# Patient Record
Sex: Female | Born: 1985 | Race: Black or African American | Hispanic: No | Marital: Single | Smoking: Never smoker
Health system: Southern US, Community
[De-identification: ages and names within clinical notes are randomized; demographics above are authoritative.]

## PROBLEM LIST (undated history)

## (undated) DIAGNOSIS — K219 Gastro-esophageal reflux disease without esophagitis: Secondary | ICD-10-CM

## (undated) DIAGNOSIS — F32A Depression, unspecified: Secondary | ICD-10-CM

## (undated) DIAGNOSIS — F419 Anxiety disorder, unspecified: Secondary | ICD-10-CM

## (undated) DIAGNOSIS — K3184 Gastroparesis: Secondary | ICD-10-CM

## (undated) HISTORY — DX: Gastroparesis: K31.84

## (undated) HISTORY — DX: Depression, unspecified: F32.A

## (undated) HISTORY — DX: Anxiety disorder, unspecified: F41.9

## (undated) HISTORY — DX: Gastro-esophageal reflux disease without esophagitis: K21.9

## (undated) HISTORY — PX: UMBILICAL HERNIA REPAIR: SHX196

## (undated) HISTORY — PX: HERNIA REPAIR: SHX51

## (undated) HISTORY — PX: OTHER SURGICAL HISTORY: SHX169

---

## 2005-03-26 ENCOUNTER — Emergency Department: Payer: Self-pay | Admitting: Emergency Medicine

## 2005-12-09 ENCOUNTER — Emergency Department: Payer: Self-pay | Admitting: Emergency Medicine

## 2007-08-19 ENCOUNTER — Emergency Department: Payer: Self-pay | Admitting: Emergency Medicine

## 2009-11-22 HISTORY — PX: UMBILICAL HERNIA REPAIR: SHX196

## 2010-04-14 ENCOUNTER — Emergency Department: Payer: Self-pay | Admitting: Emergency Medicine

## 2014-01-30 ENCOUNTER — Ambulatory Visit: Payer: Self-pay | Admitting: Family Medicine

## 2014-01-30 LAB — CBC WITH DIFFERENTIAL/PLATELET
BASOS ABS: 0 10*3/uL (ref 0.0–0.1)
Basophil %: 0.8 %
EOS PCT: 0.9 %
Eosinophil #: 0 10*3/uL (ref 0.0–0.7)
HCT: 37.8 % (ref 35.0–47.0)
HGB: 12.3 g/dL (ref 12.0–16.0)
Lymphocyte #: 1.7 10*3/uL (ref 1.0–3.6)
Lymphocyte %: 32.7 %
MCH: 28.7 pg (ref 26.0–34.0)
MCHC: 32.7 g/dL (ref 32.0–36.0)
MCV: 88 fL (ref 80–100)
MONOS PCT: 6.6 %
Monocyte #: 0.4 x10 3/mm (ref 0.2–0.9)
NEUTROS ABS: 3.2 10*3/uL (ref 1.4–6.5)
Neutrophil %: 59 %
PLATELETS: 226 10*3/uL (ref 150–440)
RBC: 4.29 10*6/uL (ref 3.80–5.20)
RDW: 16.8 % — AB (ref 11.5–14.5)
WBC: 5.4 10*3/uL (ref 3.6–11.0)

## 2014-01-30 LAB — MONONUCLEOSIS SCREEN: Mono Test: NEGATIVE

## 2014-01-30 LAB — RAPID STREP-A WITH REFLX: Micro Text Report: NEGATIVE

## 2014-02-02 LAB — BETA STREP CULTURE(ARMC)

## 2014-02-04 ENCOUNTER — Ambulatory Visit (INDEPENDENT_AMBULATORY_CARE_PROVIDER_SITE_OTHER): Payer: 59 | Admitting: Infectious Disease

## 2014-02-04 ENCOUNTER — Encounter: Payer: Self-pay | Admitting: Infectious Disease

## 2014-02-04 VITALS — BP 128/84 | HR 64 | Temp 98.4°F | Wt 163.0 lb

## 2014-02-04 DIAGNOSIS — R7611 Nonspecific reaction to tuberculin skin test without active tuberculosis: Secondary | ICD-10-CM

## 2014-02-04 DIAGNOSIS — R1319 Other dysphagia: Secondary | ICD-10-CM

## 2014-02-04 DIAGNOSIS — Z113 Encounter for screening for infections with a predominantly sexual mode of transmission: Secondary | ICD-10-CM

## 2014-02-04 DIAGNOSIS — R490 Dysphonia: Secondary | ICD-10-CM | POA: Insufficient documentation

## 2014-02-04 DIAGNOSIS — R1314 Dysphagia, pharyngoesophageal phase: Secondary | ICD-10-CM

## 2014-02-04 DIAGNOSIS — K219 Gastro-esophageal reflux disease without esophagitis: Secondary | ICD-10-CM

## 2014-02-04 DIAGNOSIS — R1310 Dysphagia, unspecified: Secondary | ICD-10-CM

## 2014-02-04 DIAGNOSIS — K3184 Gastroparesis: Secondary | ICD-10-CM | POA: Insufficient documentation

## 2014-02-04 DIAGNOSIS — R61 Generalized hyperhidrosis: Secondary | ICD-10-CM

## 2014-02-04 LAB — CBC WITH DIFFERENTIAL/PLATELET
BASOS ABS: 0 10*3/uL (ref 0.0–0.1)
Basophils Relative: 1 % (ref 0–1)
Eosinophils Absolute: 0 10*3/uL (ref 0.0–0.7)
Eosinophils Relative: 1 % (ref 0–5)
HCT: 34.4 % — ABNORMAL LOW (ref 36.0–46.0)
HEMOGLOBIN: 11.6 g/dL — AB (ref 12.0–15.0)
LYMPHS ABS: 1.7 10*3/uL (ref 0.7–4.0)
LYMPHS PCT: 41 % (ref 12–46)
MCH: 28.7 pg (ref 26.0–34.0)
MCHC: 33.7 g/dL (ref 30.0–36.0)
MCV: 85.1 fL (ref 78.0–100.0)
Monocytes Absolute: 0.3 10*3/uL (ref 0.1–1.0)
Monocytes Relative: 6 % (ref 3–12)
NEUTROS ABS: 2.1 10*3/uL (ref 1.7–7.7)
Neutrophils Relative %: 51 % (ref 43–77)
Platelets: 245 10*3/uL (ref 150–400)
RBC: 4.04 MIL/uL (ref 3.87–5.11)
RDW: 17.3 % — ABNORMAL HIGH (ref 11.5–15.5)
WBC: 4.2 10*3/uL (ref 4.0–10.5)

## 2014-02-04 MED ORDER — VITAMIN B-6 50 MG PO TABS: 50.0000 mg | ORAL_TABLET | Freq: Every day | ORAL | Status: DC

## 2014-02-04 MED ORDER — ISONIAZID 300 MG PO TABS: 300.0000 mg | ORAL_TABLET | Freq: Every day | ORAL | Status: DC

## 2014-02-04 NOTE — Progress Notes (Signed)
Subjective:    Patient ID: Sally Webb, female    DOB: 1986-10-23, 28 y.o.   MRN: [id number]  HPI  28 year old [place] American lady with PMHX significant for ventral hernia repair now with apparenty esophageal dysfunction, dysphagia of solid foods with vomiting up of food contents chronically, having refused corrective surgery. Several weeks ago she had developed severe anterior chest pain following several episodes of vomiting. She had also had night sweats that occurred in context of what she thought was a GI viral illness marked by diarrhea, and vomting that resolved and with it resolved the night sweats. She is working in the health care setting and had a PPD placed coincident with these symptoms and this was positive. She had negative CXR x 2 in March and was referred to [place].   Review of Systems  Constitutional: Negative for fever, chills, diaphoresis, activity change, appetite change, fatigue and unexpected weight change.  HENT: Negative for congestion, rhinorrhea, sinus pressure, sneezing, sore throat and trouble swallowing.   Eyes: Negative for photophobia and visual disturbance.  Respiratory: Negative for cough, chest tightness, shortness of breath, wheezing and stridor.   Cardiovascular: Negative for chest pain, palpitations and leg swelling.  Gastrointestinal: Positive for vomiting and abdominal pain. Negative for nausea, diarrhea, constipation, blood in stool, abdominal distention and anal bleeding.  Genitourinary: Negative for dysuria, hematuria, flank pain and difficulty urinating.  Musculoskeletal: Negative for arthralgias, back pain, gait problem, joint swelling and myalgias.  Skin: Negative for color change, pallor, rash and wound.  Neurological: Negative for dizziness, tremors, weakness and light-headedness.  Hematological: Negative for adenopathy. Does not bruise/bleed easily.  Psychiatric/Behavioral: Negative for behavioral problems, confusion, sleep disturbance,  dysphoric mood, decreased concentration and agitation.       Objective:   Physical Exam  Constitutional: She is oriented to person, place, and time. She appears well-developed and well-nourished. No distress.  HENT:  Head: Normocephalic and atraumatic.  Mouth/Throat: Posterior oropharyngeal erythema present. No oropharyngeal exudate.  Eyes: Conjunctivae and EOM are normal. No scleral icterus.  Neck: Normal range of motion. Neck supple. No JVD present.  Cardiovascular: Normal rate, regular rhythm and normal heart sounds.  Exam reveals no gallop and no friction rub.   No murmur heard. Pulmonary/Chest: Effort normal and breath sounds normal. No respiratory distress. She has no wheezes. She has no rales. She exhibits no tenderness.  Abdominal: She exhibits no distension and no mass. There is tenderness in the right upper quadrant, right lower quadrant, left upper quadrant and left lower quadrant. There is no rebound and no guarding.  Musculoskeletal: She exhibits no edema and no tenderness.  Lymphadenopathy:    She has no cervical adenopathy.  Neurological: She is alert and oriented to person, place, and time. She exhibits normal muscle tone. Coordination normal.  Skin: Skin is warm and dry. She is not diaphoretic. No erythema. No pallor.  Psychiatric: She has a normal mood and affect. Her behavior is normal. Judgment and thought content normal.          Assessment & Plan:   + PPD: zero evidence of pulmonary or extra pulmonary TB --therefore would rx with 9 months of INH and b6 which she can receive from health dept for free  Night sweats: were likely related to her acute GI illness and have resolved.   --I will check HIV ab and hep panel but not much point in checking for EBV and CMV since ssx have resolved .45   Hoarse voice and sore  throat: likely due to daily vomiting due to dysphagia, and gastro-esophageal dysfunction ? Anatomy --I think she should have EGD and referral for  surgery since this is persisting and vomiting and dysphagia are NOT good for her long term health --PPI may help a little but it WILL NOT correct the underlying problem

## 2014-02-05 LAB — COMPLETE METABOLIC PANEL WITH GFR
ALBUMIN: 4.4 g/dL (ref 3.5–5.2)
ALT: 8 U/L (ref 0–35)
AST: 30 U/L (ref 0–37)
Alkaline Phosphatase: 49 U/L (ref 39–117)
BUN: 4 mg/dL — AB (ref 6–23)
CALCIUM: 9.4 mg/dL (ref 8.4–10.5)
CHLORIDE: 104 meq/L (ref 96–112)
CO2: 24 meq/L (ref 19–32)
Creat: 0.75 mg/dL (ref 0.50–1.10)
GFR, Est African American: >89 mL/min
GFR, Est Non African American: >89 mL/min
Glucose, Bld: 79 mg/dL (ref 70–99)
POTASSIUM: 3.9 meq/L (ref 3.5–5.3)
Sodium: 137 mEq/L (ref 135–145)
Total Bilirubin: 0.6 mg/dL (ref 0.2–1.2)
Total Protein: 7.2 g/dL (ref 6.0–8.3)

## 2014-02-05 LAB — HIV ANTIBODY (ROUTINE TESTING W REFLEX): HIV: NONREACTIVE

## 2015-12-11 IMAGING — CR DG CHEST 2V
1 series · 2 of 2 positions shown · non-contrast
Comparison: none

[Series 1: pa · 0.17mm/px · 2 of 2 slices shown]
[im 1/2]
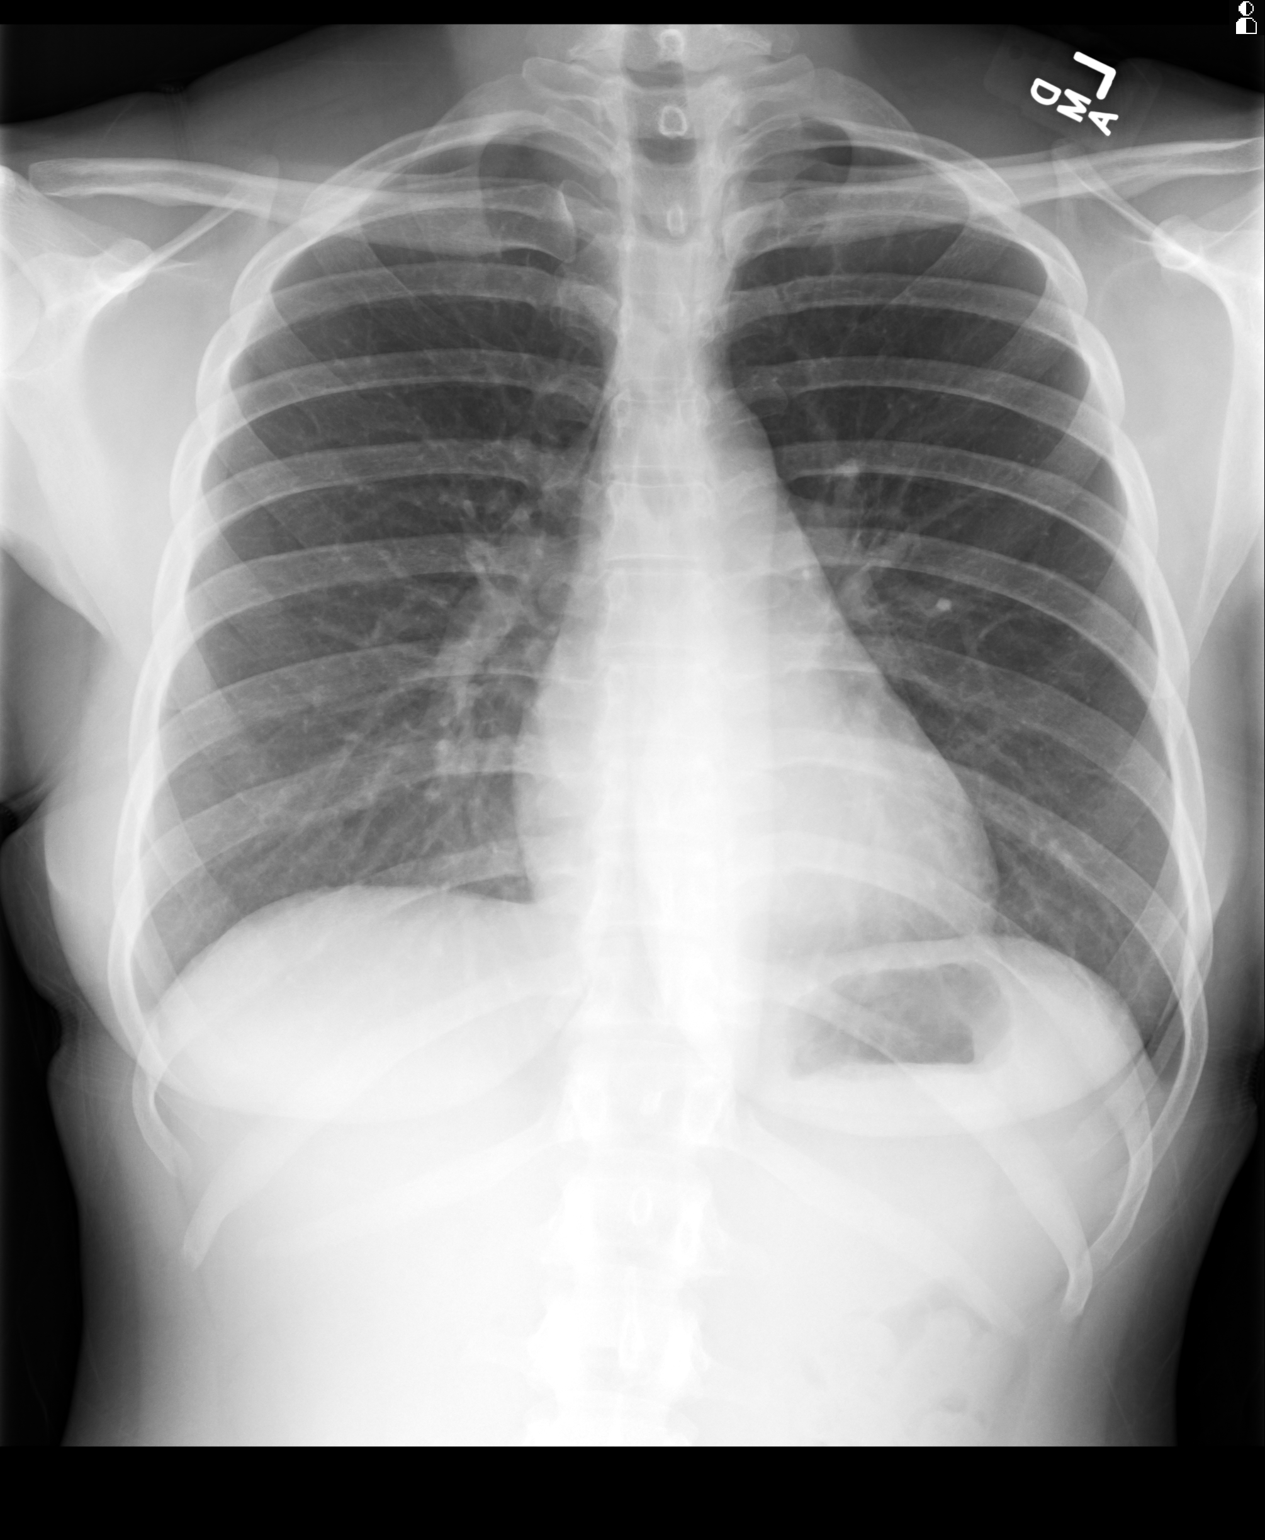
[im 2/2]
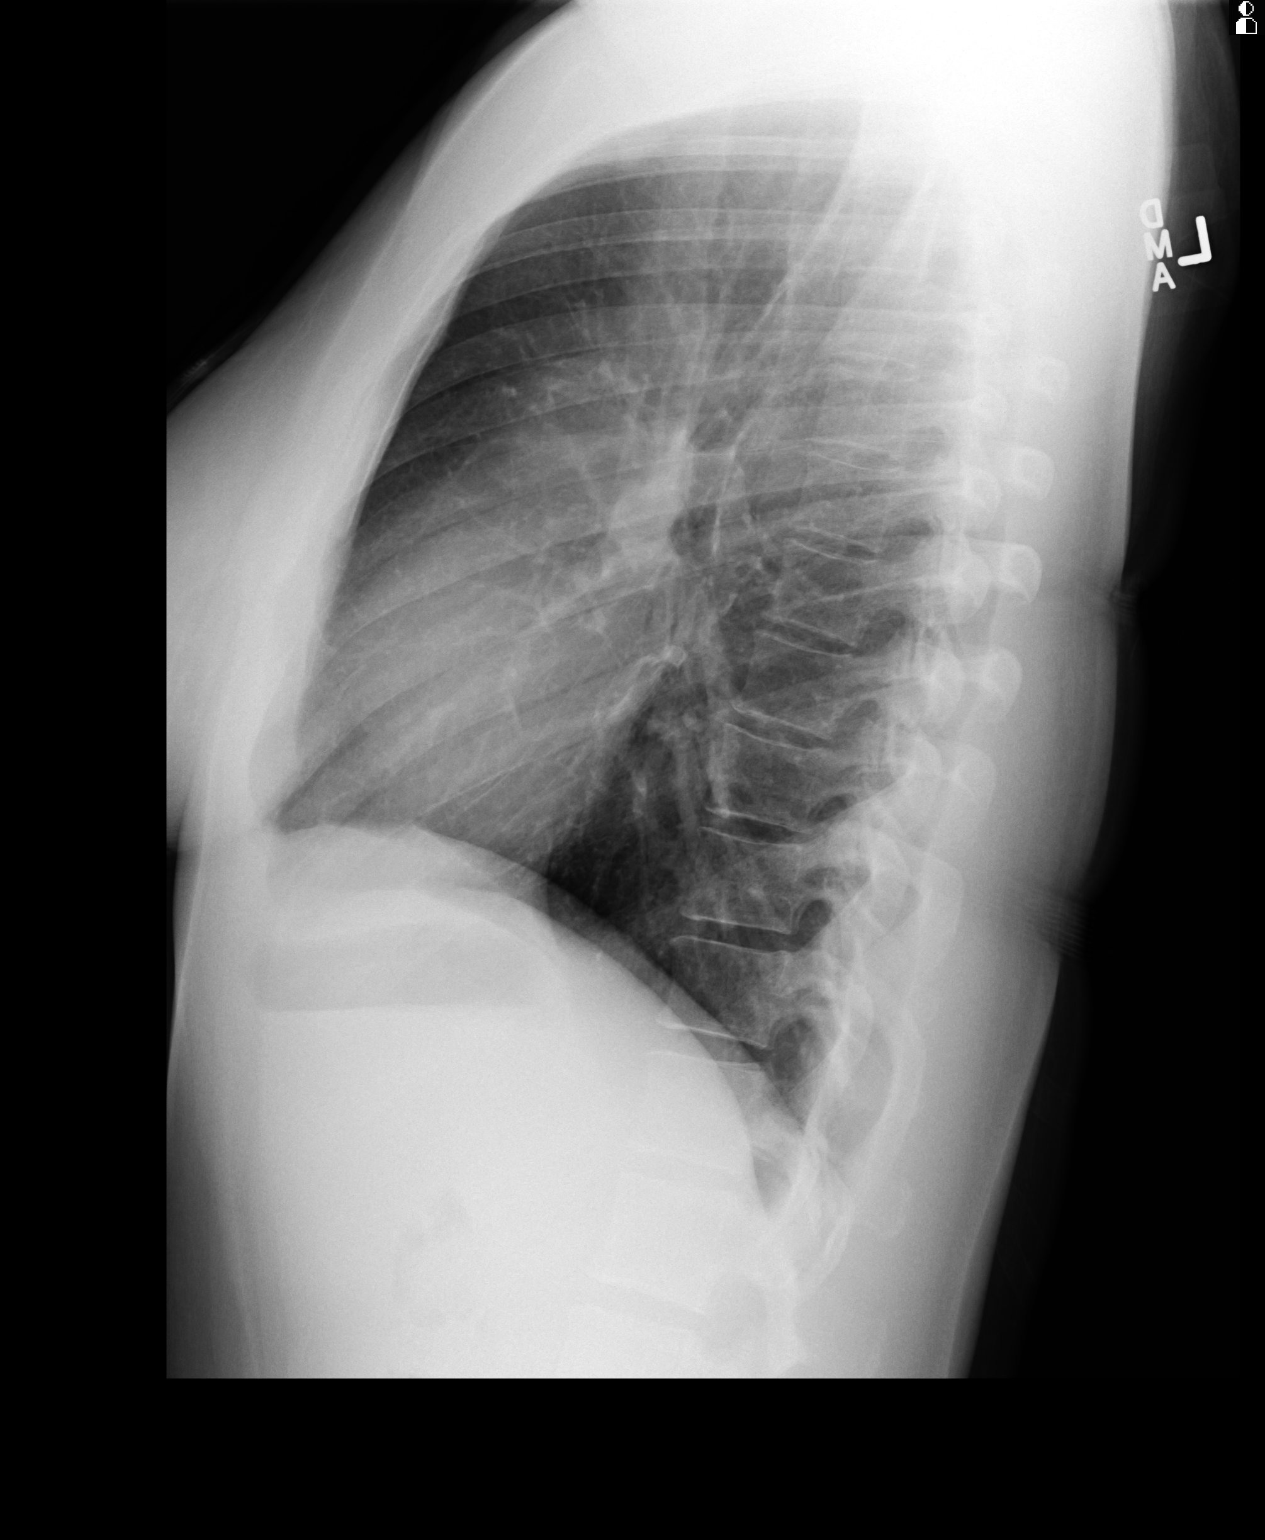

[2 of 2 positions shown; findings below may reference images not displayed]

CLINICAL DATA
Chest pain and difficulty breathing; positive tuberculin skin test

EXAM
CHEST  2 VIEW

COMPARISON
None.

FINDINGS
Lungs are clear. Heart size and pulmonary vascularity are normal. No
adenopathy. No bone lesions.

IMPRESSION
No abnormality noted.

SIGNATURE

## 2017-06-06 DIAGNOSIS — R112 Nausea with vomiting, unspecified: Secondary | ICD-10-CM | POA: Insufficient documentation

## 2022-07-30 ENCOUNTER — Encounter: Payer: Self-pay | Admitting: Nurse Practitioner

## 2022-07-30 ENCOUNTER — Ambulatory Visit: Payer: No Typology Code available for payment source | Admitting: Nurse Practitioner

## 2022-07-30 VITALS — BP 128/72 | HR 82 | Temp 96.3°F | Ht 65.25 in | Wt 169.0 lb

## 2022-07-30 DIAGNOSIS — K3184 Gastroparesis: Secondary | ICD-10-CM | POA: Diagnosis not present

## 2022-07-30 DIAGNOSIS — Z1159 Encounter for screening for other viral diseases: Secondary | ICD-10-CM

## 2022-07-30 DIAGNOSIS — R1084 Generalized abdominal pain: Secondary | ICD-10-CM | POA: Diagnosis not present

## 2022-07-30 DIAGNOSIS — R519 Headache, unspecified: Secondary | ICD-10-CM

## 2022-07-30 DIAGNOSIS — N946 Dysmenorrhea, unspecified: Secondary | ICD-10-CM

## 2022-07-30 DIAGNOSIS — Z114 Encounter for screening for human immunodeficiency virus [HIV]: Secondary | ICD-10-CM

## 2022-07-30 DIAGNOSIS — Z7689 Persons encountering health services in other specified circumstances: Secondary | ICD-10-CM

## 2022-07-30 LAB — CBC
HCT: 38.4 % (ref 36.0–46.0)
Hemoglobin: 13.2 g/dL (ref 12.0–15.0)
MCHC: 34.5 g/dL (ref 30.0–36.0)
MCV: 95.2 fl (ref 78.0–100.0)
Platelets: 214 10*3/uL (ref 150.0–400.0)
RBC: 4.04 Mil/uL (ref 3.87–5.11)
RDW: 13.5 % (ref 11.5–15.5)
WBC: 3.5 10*3/uL — ABNORMAL LOW (ref 4.0–10.5)

## 2022-07-30 LAB — COMPREHENSIVE METABOLIC PANEL
ALT: 7 U/L (ref 0–35)
AST: 16 U/L (ref 0–37)
Albumin: 4.3 g/dL (ref 3.5–5.2)
Alkaline Phosphatase: 50 U/L (ref 39–117)
BUN: 6 mg/dL (ref 6–23)
CO2: 26 mEq/L (ref 19–32)
Calcium: 9.6 mg/dL (ref 8.4–10.5)
Chloride: 102 mEq/L (ref 96–112)
Creatinine, Ser: 0.89 mg/dL (ref 0.40–1.20)
GFR: 83.46 mL/min (ref >60.00)
Glucose, Bld: 82 mg/dL (ref 70–99)
Potassium: 4.3 mEq/L (ref 3.5–5.1)
Sodium: 137 mEq/L (ref 135–145)
Total Bilirubin: 1.1 mg/dL (ref 0.2–1.2)
Total Protein: 7.3 g/dL (ref 6.0–8.3)

## 2022-07-30 LAB — TSH: TSH: 1.28 u[IU]/mL (ref 0.35–5.50)

## 2022-07-30 LAB — LIPASE: Lipase: 26 U/L (ref 11.0–59.0)

## 2022-07-30 NOTE — Progress Notes (Signed)
New Patient Office Visit  Subjective    Patient ID: Sally Webb, female    DOB: 1986-11-06  Age: 36 y.o. MRN: [id number]  CC:  Chief Complaint  Patient presents with   Establish Care   Dysmenorrhea    Feels like she needs to have hormones checked her symptoms of cycle hare lasting longer.    Headache    Increased to daily for the last month.     HPI Sally Webb presents to establish care  Period issues: States that over the past month she has noticed a difference. States that when she had her cycle she was cramping that did not stop. States that it will last 2-3 hours and then hit with a gas pain. Has been like that since last cycle started on 07/13/2022. Cycle last 4 days . History of 1 ovarian cyst in the past. State no chance of pregnancy.  Headaches: States that she started to notice that before the menstraul cycle. She will feel faint/lightheaded and started approx 1-2 years ago. States happened for a month and then stopped. States that in June around the period  she started having faint feeling and could not look up . Motion sickness has helped. This past month it starts out that way. States that it happens every morning and will cause a aura in her perphery and the headache will come. Headache lasts approx 2-3 hours. Has not tired tylenol and ibuprofen. Gets naueous and no light or sound.   History of gastro paresis: use to be followed by Gi in [place]. States that she is managed by de[facility]. States that she can eat certain things. Does not want meat. States that she will comit and experience. Tdap: update Pap smear: 2021 [place] Covid: vaccines HPV: up to date No outpatient encounter medications on file as of 07/30/2022.   No facility-administered encounter medications on file as of 07/30/2022.    Past Medical History:  Diagnosis Date   Anxiety    Depression    post pardum   Gastroparesis     Past Surgical History:  Procedure Laterality Date   UMBILICAL HERNIA  REPAIR  2011   x 2    Family History  Problem Relation Age of Onset   Diabetes Brother    Hypertension Brother    Heart failure Maternal Grandmother    Heart failure Maternal Grandfather    Cancer - Prostate Maternal Grandfather    Heart attack Paternal Grandfather     Social History   Socioeconomic History   Marital status: Single    Spouse name: Not on file   Number of children: 1   Years of education: Not on file   Highest education level: Master's degree (e.g., MA, MS, MEng, MEd, MSW, MBA)  Occupational History   Not on file  Tobacco Use   Smoking status: Never   Smokeless tobacco: Never  Vaping Use   Vaping Use: Never used  Substance and Sexual Activity   Alcohol use: Yes    Alcohol/week: 2.0 standard drinks of alcohol    Types: 2 Glasses of wine per week    Comment: on weekend   Drug use: Never   Sexual activity: Not Currently    Partners: Male  Other Topics Concern   Not on file  Social History Narrative   Fulltime: Adjuct professor - Conservation officer, historic buildings at [employer]      Sally Webb (F) 5 going on 6   Social Determinants of Health  Financial Resource Strain: Not on file  Food Insecurity: Not on file  Transportation Needs: Not on file  Physical Activity: Not on file  Stress: Not on file  Social Connections: Not on file  Intimate Partner Violence: Not on file    Review of Systems  Constitutional:  Negative for chills and fever.  Respiratory:  Negative for shortness of breath.   Cardiovascular:  Positive for leg swelling. Negative for chest pain.  Gastrointestinal:  Positive for constipation, nausea and vomiting. Negative for abdominal pain.       Takes mira lax every other day   Naturally once a week  Neurological:  Positive for dizziness and headaches. Negative for tingling and weakness.  Psychiatric/Behavioral:  Negative for hallucinations and suicidal ideas.         Objective    BP 128/72   Pulse 82   Temp (!)  96.3 F (35.7 C) (Temporal)   Ht 5' 5.25" (1.657 m)   Wt 169 lb (76.7 kg)   SpO2 98%   BMI 27.91 kg/m   Physical Exam Vitals and nursing note reviewed.  Constitutional:      Appearance: She is well-developed.  HENT:     Right Ear: Tympanic membrane, ear canal and external ear normal.     Left Ear: Tympanic membrane, ear canal and external ear normal.     Mouth/Throat:     Mouth: Mucous membranes are moist.     Pharynx: Oropharynx is clear.  Eyes:     Extraocular Movements: Extraocular movements intact.     Pupils: Pupils are equal, round, and reactive to light.  Cardiovascular:     Rate and Rhythm: Normal rate and regular rhythm.     Pulses: Normal pulses.     Heart sounds: Normal heart sounds.  Pulmonary:     Effort: Pulmonary effort is normal.     Breath sounds: Normal breath sounds.  Abdominal:     General: Bowel sounds are normal. There is no distension.     Palpations: There is no mass.     Tenderness: There is generalized abdominal tenderness.     Hernia: No hernia is present.  Musculoskeletal:     Right lower leg: No edema.     Left lower leg: No edema.  Lymphadenopathy:     Cervical: No cervical adenopathy.  Skin:    General: Skin is warm.  Neurological:     General: No focal deficit present.     Mental Status: She is alert.     Deep Tendon Reflexes:     Reflex Scores:      Bicep reflexes are 2+ on the right side and 2+ on the left side.      Patellar reflexes are 2+ on the right side and 2+ on the left side.    Comments: Bilateral upper and lower extremity strength          Assessment & Plan:   Problem List Items Addressed This Visit       Digestive   Gastroparesis    Historical diagnosis.  Patient states she is followed by GI in [place].  Ambulatory referral placed to local GI.  Patient states she has tried medicines in the past and was the largest band of taking medication.      Relevant Orders   TSH   Ambulatory referral to  Gastroenterology   Ambulatory referral to Gynecology     Genitourinary   Dysmenorrhea    As of late.  Her menstrual cycle  is still normal for her.  States she is having a cramping sensation even after her menstrual period is completed.  Does have a history of ovarian cyst that is very remote no abnormal bleeding.  Pending labs.  Ambulatory referral to GYN placed today      Relevant Orders   TSH     Other   Need for hepatitis C screening test   Relevant Orders   Hepatitis C Antibody   Generalized abdominal pain    This could be multifactorial given patient with history of gastroparesis and dysmenorrhea.  Pending basic lab work ambulatory referral placed to GI today      Relevant Orders   CBC   Lipase   Comprehensive metabolic panel   Frequent headaches - Primary    Patient thinks this is tied to her menstrual cycles.  Has not tried over-the-counter medications as of yet.  Encouraged to try Tylenol or ibuprofen.  Also encouraged adequate hydration and rest.      Relevant Orders   CBC   Encounter to establish care    Able to see previous OB/GYN notes but no previous primary care provider notes in EMR.      Other Visit Diagnoses     Screening for HIV (human immunodeficiency virus)       Relevant Orders   HIV antibody (with reflex)       Return in about 6 months (around 01/28/2023) for CPE.   Audria Nine, NP

## 2022-07-30 NOTE — Assessment & Plan Note (Signed)
Historical diagnosis.  Patient states she is followed by GI in [place].  Ambulatory referral placed to local GI.  Patient states she has tried medicines in the past and was the largest band of taking medication.

## 2022-07-30 NOTE — Assessment & Plan Note (Signed)
Patient thinks this is tied to her menstrual cycles.  Has not tried over-the-counter medications as of yet.  Encouraged to try Tylenol or ibuprofen.  Also encouraged adequate hydration and rest.

## 2022-07-30 NOTE — Assessment & Plan Note (Signed)
This could be multifactorial given patient with history of gastroparesis and dysmenorrhea.  Pending basic lab work ambulatory referral placed to GI today

## 2022-07-30 NOTE — Assessment & Plan Note (Signed)
As of late.  Her menstrual cycle is still normal for her.  States she is having a cramping sensation even after her menstrual period is completed.  Does have a history of ovarian cyst that is very remote no abnormal bleeding.  Pending labs.  Ambulatory referral to GYN placed today

## 2022-07-30 NOTE — Patient Instructions (Signed)
Nice to see you today I will be in touch with the labs once I have the results Follow up with me in 6 months for a physical, sooner if you need me

## 2022-07-30 NOTE — Assessment & Plan Note (Signed)
Able to see previous OB/GYN notes but no previous primary care provider notes in EMR.

## 2022-08-02 LAB — HEPATITIS C ANTIBODY: Hepatitis C Ab: NONREACTIVE

## 2022-08-02 LAB — HIV ANTIBODY (ROUTINE TESTING W REFLEX): HIV 1&2 Ab, 4th Generation: NONREACTIVE

## 2022-08-10 ENCOUNTER — Telehealth: Payer: Self-pay | Admitting: Obstetrics & Gynecology

## 2022-08-10 NOTE — Telephone Encounter (Signed)
[facility] referring for Manage paps and dysmenorrhea. I contacted patient via phone. I left voicemail for patient to call back to be scheduled.

## 2022-08-12 NOTE — Telephone Encounter (Signed)
I contacted patient via phone. I left voicemail for patient to call back to be scheduled.  Contacting referring office about multiple attempts to reach patient have been unsuccessful. Mailing referral notice.

## 2022-08-31 ENCOUNTER — Encounter: Payer: No Typology Code available for payment source | Admitting: Obstetrics & Gynecology

## 2022-09-09 ENCOUNTER — Encounter: Payer: Self-pay | Admitting: Obstetrics and Gynecology

## 2022-10-10 NOTE — Progress Notes (Unsigned)
Sally Emms, NP   No chief complaint on file.   HPI:      Sally Webb is a 36 y.o. No obstetric history on file. whose LMP was No LMP recorded., presents today for NP eval of recent dysmen, referred by PCP.  Hx of ASCUS/pos HPV DNA 7/18; neg repeat paps since. 5/21 with neg pap/neg HPV DNA.   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   Hoarse voice quality 02/04/2014   Gastroparesis     Past Surgical History:  Procedure Laterality Date   gastric bulge     HERNIA REPAIR     UMBILICAL HERNIA REPAIR     UMBILICAL HERNIA REPAIR  2011   x 2    Family History  Problem Relation Age of Onset   Diabetes Brother    Hypertension Brother    Heart failure Maternal Grandmother    Heart failure Maternal Grandfather    Cancer - Prostate Maternal Grandfather    Heart attack Paternal Grandfather    Hypertension Sister    Diabetes Paternal Grandfather    Hypertension Paternal Grandfather     Social History   Socioeconomic History   Marital status: Single    Spouse name: Not on file   Number of children: 1   Years of education: Not on file   Highest education level: Master's degree (e.g., MA, MS, MEng, MEd, MSW, MBA)  Occupational History   Not on file  Tobacco Use   Smoking status: Never   Smokeless tobacco: Never  Vaping Use   Vaping Use: Never used  Substance and Sexual Activity   Alcohol use: Yes    Alcohol/week: 2.0 standard drinks of alcohol    Types: 2 Glasses of wine per week    Comment: on weekend   Drug use: Never   Sexual activity: Not Currently    Partners: Male    Birth control/protection: Condom  Other Topics Concern   Not on file  Social  History Narrative   ** Merged History Encounter **       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 30 tablet 8   pyridOXINE (VITAMIN B-6) 50 MG tablet Take 1 tablet (50 mg total) by mouth daily. 30 tablet 8   No facility-administered medications prior to visit.      ROS:  Review of Systems BREAST: No symptoms   OBJECTIVE:   Vitals:  There were no vitals taken for this visit.  Physical Exam  Results: No results found for this or any previous visit (from the past 24 hour(s)).   Assessment/Plan: No diagnosis found.    No orders of the defined types were  placed in this encounter.     No follow-ups on file.  Blayn Whetsell B. Aime Meloche, PA-C 10/10/2022 5:18 PM

## 2022-10-11 ENCOUNTER — Encounter: Payer: Self-pay | Admitting: Obstetrics and Gynecology

## 2022-10-11 ENCOUNTER — Other Ambulatory Visit (HOSPITAL_COMMUNITY)
Admission: RE | Admit: 2022-10-11 | Discharge: 2022-10-11 | Disposition: A | Discharge status: Home / Self Care | Payer: No Typology Code available for payment source | Source: Ambulatory Visit | Attending: Obstetrics & Gynecology | Admitting: Obstetrics & Gynecology

## 2022-10-11 ENCOUNTER — Ambulatory Visit: Payer: No Typology Code available for payment source | Admitting: Obstetrics and Gynecology

## 2022-10-11 VITALS — BP 110/70 | Ht 65.0 in | Wt 174.0 lb

## 2022-10-11 DIAGNOSIS — Z124 Encounter for screening for malignant neoplasm of cervix: Secondary | ICD-10-CM | POA: Diagnosis present

## 2022-10-11 DIAGNOSIS — N921 Excessive and frequent menstruation with irregular cycle: Secondary | ICD-10-CM | POA: Insufficient documentation

## 2022-10-11 DIAGNOSIS — R87810 Cervical high risk human papillomavirus (HPV) DNA test positive: Secondary | ICD-10-CM | POA: Insufficient documentation

## 2022-10-11 DIAGNOSIS — Z1151 Encounter for screening for human papillomavirus (HPV): Secondary | ICD-10-CM

## 2022-10-11 DIAGNOSIS — R102 Pelvic and perineal pain: Secondary | ICD-10-CM

## 2022-10-11 NOTE — Patient Instructions (Signed)
I value your feedback and you entrusting us with your care. If you get a [facility] patient survey, I would appreciate you taking the time to let us know about your experience today. Thank you! ? ? ?

## 2022-10-12 LAB — CYTOLOGY - PAP
Comment: NEGATIVE
Diagnosis: NEGATIVE
High risk HPV: NEGATIVE

## 2022-11-02 ENCOUNTER — Other Ambulatory Visit: Payer: No Typology Code available for payment source

## 2022-11-02 ENCOUNTER — Telehealth: Payer: Self-pay | Admitting: Obstetrics and Gynecology

## 2022-11-02 NOTE — Telephone Encounter (Signed)
Reached out tp pt to reschedule the gyn [place] that was scheduled on 11/02/22 at 4:00.  Left message for pt to call back.

## 2022-11-03 ENCOUNTER — Encounter: Payer: Self-pay | Admitting: Obstetrics and Gynecology

## 2022-11-03 NOTE — Telephone Encounter (Signed)
Reached out to pt (2x) to reschedule the gyn [place] that was scheduled on 11/02/22 at 4:00.  Left message for pt to call back.  Will send a MyChart letter.

## 2022-12-13 ENCOUNTER — Ambulatory Visit: Payer: No Typology Code available for payment source | Admitting: Gastroenterology

## 2024-02-10 ENCOUNTER — Ambulatory Visit (INDEPENDENT_AMBULATORY_CARE_PROVIDER_SITE_OTHER): Admitting: Family Medicine

## 2024-02-10 ENCOUNTER — Encounter: Payer: Self-pay | Admitting: Family Medicine

## 2024-02-10 VITALS — BP 131/87 | HR 66 | Ht 64.0 in | Wt 185.4 lb

## 2024-02-10 DIAGNOSIS — F321 Major depressive disorder, single episode, moderate: Secondary | ICD-10-CM

## 2024-02-10 DIAGNOSIS — M25561 Pain in right knee: Secondary | ICD-10-CM

## 2024-02-10 DIAGNOSIS — F411 Generalized anxiety disorder: Secondary | ICD-10-CM

## 2024-02-10 DIAGNOSIS — L659 Nonscarring hair loss, unspecified: Secondary | ICD-10-CM

## 2024-02-10 DIAGNOSIS — M791 Myalgia, unspecified site: Secondary | ICD-10-CM

## 2024-02-10 DIAGNOSIS — M25522 Pain in left elbow: Secondary | ICD-10-CM

## 2024-02-10 DIAGNOSIS — M25562 Pain in left knee: Secondary | ICD-10-CM

## 2024-02-10 DIAGNOSIS — R5382 Chronic fatigue, unspecified: Secondary | ICD-10-CM | POA: Diagnosis not present

## 2024-02-10 DIAGNOSIS — M25521 Pain in right elbow: Secondary | ICD-10-CM

## 2024-02-10 DIAGNOSIS — Z7689 Persons encountering health services in other specified circumstances: Secondary | ICD-10-CM

## 2024-02-10 NOTE — Progress Notes (Signed)
 New Patient Office Visit  Subjective   Patient ID: Sally Webb, female    DOB: Jun 13, 1986  Age: 38 y.o. MRN: [id number]  CC:  Chief Complaint  Patient presents with   Establish Care    Establish care, weight gain, dry mouth all the time , joint pain all the time mainly in the knees shoulder, elbow, wrist, and always tired , seen PCP last year in [place], only seen him 1 time, prior to that [place]    HPI Sally Webb is a 38 year old female who presents to establish with [facility].   CC: Patient here to establish care  Last PCP: [facility]  Specialists: GI, OBGYN   [place] 5 years ago-  Sports Medicine  [place]   Just not feeling myself-- Dec 2024 inflammation & puffiness around face/eyes  Wakes up tired, always tired-- fatigue  Sleeping 10-11 hours  Weight gain  Feels puffy in face, legs, joints  Very active in working out- this is a task for her. 20 minutes she is exhausted.  Dry mouth- started around December. Thought it was changed in toothpaste. Tried Bitotene for mouth. Does help. Staying hydrated.  Reports butterfly rash  Weight gain- difficult for her to digest foods-- started in 2023, but noticed more in Jan 2025 Normal weight is 160-165 lbs  Last weight 09/2022: 78.9kg  Clothes are not fitting, not normal for her  Reports increase in breast size. Denies nipple discharge and tenderness to palpation.   Hair loss- thinning more recently   Mood- very irritated   Gastroparesis- nerve damage to sphincter  Top is hyperactive, low one is slow Delayed emptying of stomach  Baclofen  Pain in stomach, sharp and long-- this is new for her  Mid-epigastric/periumbilical region   Elevated BP- has not noticed this in the past  Feels this is related to her weight gain       02/10/2024    9:41 AM 07/30/2022   11:01 AM  PHQ9 SCORE ONLY  PHQ-9 Total Score 15 2      02/10/2024    9:41 AM  GAD 7 : Generalized Anxiety  Score  Nervous, Anxious, on Edge 1  Control/stop worrying 1  Worry too much - different things 1  Trouble relaxing 2  Restless 1  Easily annoyed or irritable 2  Afraid - awful might happen 0  Total GAD 7 Score 8  Anxiety Difficulty Somewhat difficult    No outpatient encounter medications on file as of 02/10/2024.   No facility-administered encounter medications on file as of 02/10/2024.    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 02/04/2014   Hoarse voice quality 02/04/2014   Gastroparesis    Past Medical History:  Diagnosis Date   Anxiety    Depression    post pardum   Gastroparesis    GERD (gastroesophageal reflux disease)    Past Surgical History:  Procedure Laterality Date   gastric bulge     HERNIA REPAIR     UMBILICAL HERNIA REPAIR     UMBILICAL HERNIA REPAIR  2011   x 2   Family History  Problem Relation Age of Onset   Diabetes Brother    Hypertension Brother    Heart failure Maternal Grandmother    Heart failure Maternal Grandfather    Cancer - Prostate Maternal Grandfather    Heart attack Paternal Grandfather    Hypertension Sister    Diabetes Paternal Grandfather    Hypertension Paternal Grandfather    Social History   Socioeconomic History   Marital status: Single    Spouse name: Not on file   Number of children: 1   Years of education: Not on file   Highest education level: Master's degree (e.g., MA, MS, MEng, MEd, MSW, MBA)  Occupational History   Not on file  Tobacco Use   Smoking status: Never    Passive  exposure: Never   Smokeless tobacco: Never  Vaping Use   Vaping status: Never Used  Substance and Sexual Activity   Alcohol use: Yes    Alcohol/week: 2.0 standard drinks of alcohol    Types: 2 Glasses of wine per week    Comment: on weekend   Drug use: Never   Sexual activity: Not Currently    Partners: Male    Birth control/protection: None  Other Topics Concern   Not on file  Social History Narrative   ** Merged History Encounter **       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   No facility-administered medications prior to visit.   Allergies  Allergen Reactions   Egg-Derived Products Hives    Hives and itching   ROS: See HPI    Objective   Today's Vitals   02/10/24 0936  BP: (!) 145/99  Pulse: 66  SpO2: 94%  Weight: 185 lb 6.4 oz (84.1 kg)  Height: 5\' 4"  (1.626 m)  PainSc: 3   PainLoc: Generalized    GENERAL: Well-appearing, in NAD. Well nourished.  SKIN: Pink, warm and dry. No rash, lesion, ulceration, or ecchymoses.  Head: Normocephalic. NECK: Trachea midline. Full ROM w/o pain or tenderness. No lymphadenopathy.  EARS: Tympanic membranes are intact, translucent without bulging and without drainage. Appropriate landmarks visualized.  EYES: Conjunctiva clear without exudates. EOMI, PERRL, no drainage present.  NOSE: Septum midline w/o deformity. Nares patent, mucosa pink and non-inflamed w/o drainage. No sinus tenderness.  THROAT: Uvula midline. Oropharynx clear. Tonsils non-inflamed without exudate. Mucous membranes pink and moist.  RESPIRATORY: Chest wall symmetrical.  Respirations even and non-labored. Breath sounds clear to auscultation bilaterally.  CARDIAC: S1, S2 present, regular rate and rhythm without murmur or gallops. Peripheral pulses 2+ bilaterally.  MSK: Muscle tone and strength appropriate for age. Joints w/o tenderness, redness, or swelling.  EXTREMITIES: Without clubbing, cyanosis, or edema.  NEUROLOGIC: No motor or sensory deficits. Steady, even gait. C2-C12 intact.  PSYCH/MENTAL STATUS: Alert, oriented x 3. Cooperative, appropriate mood and affect.     Assessment & Plan:   1. Encounter to establish care (Primary) Patient is a 69- year-old female who presents today to establish care with primary care at [facility]. Reviewed the past medical history, family  history, social history, surgical history, medications and allergies today- updates made as indicated. Patient has concerns today about multiple concerns (see below).  2. Chronic fatigue Patient reports she has had issues with chronic fatigue in the past. She reports having extensive blood work performed without any clear diagnosis. Unable to view previous medical records. Will repeat labs today and update patient with results.  - CBC with Differential/Platelet - TSH Rfx on Abnormal to Free T4 - Comprehensive metabolic panel - HIV antibody (with reflex) - Lyme Disease Serology w/Reflex  3. Hair loss Patient reports noticing an increase in her hair loss. No visible alopecia present during exam. Last thyroid function was checked 07/2022.  Will repeat thyroid function today and update patient with results. - TSH Rfx on Abnormal to Free T4  4. Arthralgia of both knees Patient reports increase in knee pain bilaterally.  Physical exam without warmth, fluid appreciated, tenderness, laxity.  Negative McMurry test and Lachman test.  Will obtain labs for possible autoimmune disease and signs of inflammation - Rheumatoid Factor - ANA Direct w/Reflex if Positive - C-reactive protein - Sed Rate  (ESR) - CYCLIC CITRUL PEPTIDE ANTIBODY, IGG/IGA  5. Muscular aches Patient reports muscular pain.  Normal gait, normal motor and strength.  Denies one-sided weakness and focal weakness.  Normal neurological exam cranial nerves II through XII intact.   - CK (Creatine Kinase)  6. Arthralgia of both elbows Will assess for arthritis due to symptoms present. Will obtain records.  - Rheumatoid Factor - ANA Direct w/Reflex if Positive - C-reactive protein - Sed Rate (ESR) - CYCLIC CITRUL PEPTIDE ANTIBODY, IGG/IGA  7. Current moderate episode of major depressive disorder without prior episode (HCC) PHQ-9 completed with score of 15. Denies SI/HI.  Reports that her mood is not an issue at this time and would not like to start therapy or medication.  8. GAD (generalized anxiety disorder) GAD-7 completed with score of 8. See #7.    Return in about 6 weeks (around 03/23/2024) for joint pain & mood & blood pressure .   Alyson Reedy, FNP

## 2024-02-10 NOTE — Patient Instructions (Signed)

## 2024-02-13 LAB — COMPREHENSIVE METABOLIC PANEL
ALT: 12 IU/L (ref 0–32)
AST: 23 IU/L (ref 0–40)
Albumin: 4.3 g/dL (ref 3.9–4.9)
Alkaline Phosphatase: 68 IU/L (ref 44–121)
BUN/Creatinine Ratio: 5 — ABNORMAL LOW (ref 9–23)
BUN: 4 mg/dL — ABNORMAL LOW (ref 6–20)
Bilirubin Total: 0.2 mg/dL (ref 0.0–1.2)
CO2: 25 mmol/L (ref 20–29)
Calcium: 9.2 mg/dL (ref 8.7–10.2)
Chloride: 101 mmol/L (ref 96–106)
Creatinine, Ser: 0.8 mg/dL (ref 0.57–1.00)
Globulin, Total: 2.6 g/dL (ref 1.5–4.5)
Glucose: 87 mg/dL (ref 70–99)
Potassium: 4.3 mmol/L (ref 3.5–5.2)
Sodium: 139 mmol/L (ref 134–144)
Total Protein: 6.9 g/dL (ref 6.0–8.5)
eGFR: 97 mL/min/{1.73_m2} (ref >59)

## 2024-02-13 LAB — RHEUMATOID FACTOR: Rheumatoid fact SerPl-aCnc: <10 [IU]/mL (ref <14.0)

## 2024-02-13 LAB — CBC WITH DIFFERENTIAL/PLATELET
Basophils Absolute: 0 10*3/uL (ref 0.0–0.2)
Basos: 1 %
EOS (ABSOLUTE): 0.1 10*3/uL (ref 0.0–0.4)
Eos: 2 %
Hematocrit: 37.6 % (ref 34.0–46.6)
Hemoglobin: 12.4 g/dL (ref 11.1–15.9)
Immature Grans (Abs): 0 10*3/uL (ref 0.0–0.1)
Immature Granulocytes: 0 %
Lymphocytes Absolute: 1.2 10*3/uL (ref 0.7–3.1)
Lymphs: 38 %
MCH: 31.7 pg (ref 26.6–33.0)
MCHC: 33 g/dL (ref 31.5–35.7)
MCV: 96 fL (ref 79–97)
Monocytes Absolute: 0.3 10*3/uL (ref 0.1–0.9)
Monocytes: 8 %
Neutrophils Absolute: 1.6 10*3/uL (ref 1.4–7.0)
Neutrophils: 51 %
Platelets: 236 10*3/uL (ref 150–450)
RBC: 3.91 x10E6/uL (ref 3.77–5.28)
RDW: 13 % (ref 11.7–15.4)
WBC: 3.1 10*3/uL — ABNORMAL LOW (ref 3.4–10.8)

## 2024-02-13 LAB — LYME DISEASE SEROLOGY W/REFLEX: Lyme Total Antibody EIA: NEGATIVE

## 2024-02-13 LAB — HIV ANTIBODY (ROUTINE TESTING W REFLEX): HIV Screen 4th Generation wRfx: NONREACTIVE

## 2024-02-13 LAB — CK: Total CK: 120 U/L (ref 32–182)

## 2024-02-13 LAB — CYCLIC CITRUL PEPTIDE ANTIBODY, IGG/IGA: Cyclic Citrullin Peptide Ab: <1 U (ref 0–19)

## 2024-02-13 LAB — ANA W/REFLEX IF POSITIVE: Anti Nuclear Antibody (ANA): NEGATIVE

## 2024-02-13 LAB — C-REACTIVE PROTEIN: CRP: 2 mg/L (ref 0–10)

## 2024-02-13 LAB — TSH RFX ON ABNORMAL TO FREE T4: TSH: 1.71 u[IU]/mL (ref 0.450–4.500)

## 2024-02-13 LAB — SEDIMENTATION RATE: Sed Rate: 11 mm/h (ref 0–32)

## 2024-02-14 ENCOUNTER — Other Ambulatory Visit: Payer: Self-pay | Admitting: Family Medicine

## 2024-02-14 ENCOUNTER — Encounter: Payer: Self-pay | Admitting: Family Medicine

## 2024-02-21 MED ORDER — AMLODIPINE BESYLATE 5 MG PO TABS: 5.0000 mg | ORAL_TABLET | Freq: Every day | ORAL | 3 refills | Status: DC

## 2024-03-23 ENCOUNTER — Ambulatory Visit: Admitting: Family Medicine

## 2024-10-22 ENCOUNTER — Ambulatory Visit
Admission: RE | Admit: 2024-10-22 | Discharge: 2024-10-22 | Disposition: A | Source: Ambulatory Visit | Attending: Family Medicine | Admitting: Family Medicine

## 2024-10-22 ENCOUNTER — Telehealth: Payer: Self-pay | Admitting: Family Medicine

## 2024-10-22 ENCOUNTER — Encounter: Payer: Self-pay | Admitting: Family Medicine

## 2024-10-22 ENCOUNTER — Ambulatory Visit: Admitting: Family Medicine

## 2024-10-22 ENCOUNTER — Ambulatory Visit
Admission: RE | Admit: 2024-10-22 | Discharge: 2024-10-22 | Disposition: A | Discharge status: Home / Self Care | Attending: Family Medicine | Admitting: Family Medicine

## 2024-10-22 VITALS — BP 138/88 | HR 88 | Resp 16 | Ht 64.0 in | Wt 184.0 lb

## 2024-10-22 DIAGNOSIS — K5900 Constipation, unspecified: Secondary | ICD-10-CM | POA: Diagnosis not present

## 2024-10-22 DIAGNOSIS — R112 Nausea with vomiting, unspecified: Secondary | ICD-10-CM

## 2024-10-22 DIAGNOSIS — K3184 Gastroparesis: Secondary | ICD-10-CM

## 2024-10-22 MED ORDER — LINACLOTIDE 145 MCG PO CAPS: 145.0000 ug | ORAL_CAPSULE | Freq: Every day | ORAL | 3 refills | Status: DC

## 2024-10-22 NOTE — Telephone Encounter (Signed)
 Reviewed her KUB findings.  Asked her to start Linzess 145 mcg daily. FOLLOW-UP in office in 2 weeks.

## 2024-10-22 NOTE — Progress Notes (Signed)
 Established Patient Office Visit  Subjective   Patient ID: Sally Webb, female    DOB: 1986-07-10  Age: 38 y.o. MRN: [id number]  Chief Complaint  Patient presents with   Bloated   Abdominal Pain   Constipation    HPI Discussed the use of AI scribe software for clinical note transcription with the patient, who gave verbal consent to proceed.  History of Present Illness    Sally Webb is a 38 year old female with gastroparesis who presents with worsening abdominal symptoms and bowel irregularities.  She has experienced significant abdominal discomfort and bloating that has progressively worsened over the past six to seven months. After eating, her stomach becomes extremely bloated, accompanied by pain and a sensation of puffiness, particularly around her eyes, which she likens to an allergic reaction.  She reports a diagnosis of gastroparesis in 2011 and has previously used baclofen; she has also made dietary adjustments. She experiences daily vomiting, especially after consuming solids, describing the sensation as food feeling 'stuck' in her throat and then regurgitating. Smoothies are generally tolerated if no solids are consumed afterward.  Bowel movements are infrequent, occurring every four to five days, characterized by initial hard stools followed by diarrhea. Her last bowel movement was on Thanksgiving, which was hard, followed by diarrhea today after drinking coffee. She takes Miralax daily, using a cap full, and drinks a gallon to a gallon and a half of water daily.  Dietary restrictions due to her symptoms include avoiding high-fiber foods like broccoli and cereals, which exacerbate her symptoms. She has developed intolerance to dairy, experiencing facial puffiness and swelling around her eyes after consumption, and has switched to oat milk, which she tolerates.  She has a history of umbilical hernia repair in 2011 and uses a hernia belt for relief from abdominal  discomfort. Her weight is higher than usual, close to her pregnancy weight, and she finds sitting uncomfortable, often needing to lie down or apply pressure to her abdomen for relief.  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 No reflux symptoms are reported, but she notes a change in her voice to raspy after vomiting.     Sally Webb is a 38 year old female with gastroparesis who presents with worsening abdominal symptoms and bowel irregularities.  She has experienced significant abdominal discomfort and bloating that has progressively worsened over the past six to seven months. After eating, her stomach becomes extremely bloated, accompanied by pain and a sensation of puffiness, particularly around her eyes, which she likens to an allergic reaction.  She reports a diagnosis of gastroparesis in 2011 and has previously used baclofen; she has also made dietary adjustments. She experiences daily vomiting, especially after consuming solids, describing the sensation as food feeling 'stuck' in her throat and then regurgitating. Smoothies are generally tolerated if no solids are consumed afterward.  Bowel movements are infrequent, occurring every four to five days, characterized by initial hard stools followed by diarrhea. Her last bowel movement was on Thanksgiving, which was hard, followed by diarrhea today after drinking coffee. She takes Miralax daily, using a cap full, and drinks a gallon to a gallon and a half of water daily.  Dietary restrictions due to her symptoms include avoiding high-fiber foods like broccoli and cereals, which exacerbate her symptoms. She has developed intolerance to dairy, experiencing facial puffiness and swelling around her eyes after consumption, and has switched to oat milk, which she tolerates.  She  has a history of umbilical hernia repair in 2011 and uses a hernia belt for relief from abdominal discomfort. Her weight is  higher than usual, close to her pregnancy weight, and she finds sitting uncomfortable, often needing to lie down or apply pressure to her abdomen for relief.  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 No reflux symptoms are reported, but she notes a change in her voice to raspy after vomiting.      Objective:     BP 138/88   Pulse 88   Resp 16   Ht 5' 4 (1.626 m)   Wt 184 lb (83.5 kg)   LMP 09/30/2024 Comment: signed preg waiver  SpO2 98%   BMI 31.58 kg/m    Physical Exam Abdominal:     General: Abdomen is flat. Bowel sounds are normal.     Palpations: Abdomen is soft.     Tenderness: There is no abdominal tenderness.          No results found for any visits on 10/22/24.    The ASCVD Risk score (Arnett DK, et al., 2019) failed to calculate for the following reasons:   The 2019 ASCVD risk score is only valid for ages 65 to 83    Assessment & Plan:  Constipation, unspecified constipation type Assessment & Plan: Showed her a Bristol scale and she picked 2 as most representative of her bowel movements.  Constipation with gastroparesis and esophageal dysmotility.  Chronic constipation with episodes of diarrhea, abdominal distention and pain.  Will check a KUB and if she has a high stool burden we will ask her to start Linzess  145.  Follow-up in a month.  Orders: -     DG Abd 1 View; Future -     Ambulatory referral to Gastroenterology -     linaCLOtide ; Take 1 capsule (145 mcg total) by mouth daily.  Dispense: 30 capsule; Refill: 3  Gastroparesis Assessment & Plan: She reports that she has been diagnosed with gastroparesis but she has never taken Reglan and or E-Mycin.  Lets see if more frequent and consistent bowel movements will improve this.  Otherwise referral to gastro   Nausea and vomiting, unspecified vomiting type Assessment & Plan: Will see if correcting her constipation will help with the daily vomiting, otherwise  referral to GI           Return in about 2 weeks (around 11/05/2024).    Robby Pirani K Numa Heatwole, MD

## 2024-10-26 ENCOUNTER — Ambulatory Visit

## 2024-10-26 DIAGNOSIS — K5900 Constipation, unspecified: Secondary | ICD-10-CM | POA: Insufficient documentation

## 2024-10-26 NOTE — Assessment & Plan Note (Signed)
 Will see if correcting her constipation will help with the daily vomiting, otherwise referral to GI

## 2024-10-26 NOTE — Assessment & Plan Note (Addendum)
 She reports that she has been diagnosed with gastroparesis but she has never taken Reglan and or E-Mycin.  Lets see if more frequent and consistent bowel movements will improve this.  Otherwise referral to gastro

## 2024-10-26 NOTE — Assessment & Plan Note (Addendum)
 Showed her a Bristol scale and she picked 2 as most representative of her bowel movements.  Constipation with gastroparesis and esophageal dysmotility.  Chronic constipation with episodes of diarrhea, abdominal distention and pain.  Will check a KUB and if she has a high stool burden we will ask her to start Linzess  145.  Follow-up in a month.

## 2024-10-29 NOTE — Telephone Encounter (Signed)
 This is an error.

## 2024-11-05 ENCOUNTER — Ambulatory Visit: Admitting: Family Medicine

## 2024-11-26 ENCOUNTER — Ambulatory Visit: Admitting: Family Medicine

## 2024-11-30 ENCOUNTER — Ambulatory Visit: Admitting: Family Medicine

## 2024-12-11 ENCOUNTER — Ambulatory Visit: Admitting: Family Medicine

## 2024-12-11 ENCOUNTER — Encounter: Payer: Self-pay | Admitting: Family Medicine

## 2024-12-11 VITALS — BP 130/84 | HR 60 | Resp 16 | Ht 64.0 in | Wt 176.0 lb

## 2024-12-11 DIAGNOSIS — K581 Irritable bowel syndrome with constipation: Secondary | ICD-10-CM | POA: Diagnosis not present

## 2024-12-11 DIAGNOSIS — R030 Elevated blood-pressure reading, without diagnosis of hypertension: Secondary | ICD-10-CM

## 2024-12-11 DIAGNOSIS — F411 Generalized anxiety disorder: Secondary | ICD-10-CM

## 2024-12-11 MED ORDER — LINACLOTIDE 290 MCG PO CAPS: 290.0000 ug | ORAL_CAPSULE | Freq: Every day | ORAL | 5 refills | Status: AC

## 2024-12-11 NOTE — Progress Notes (Signed)
 "  Established Patient Office Visit  Subjective  Patient ID: Sally Webb, female    DOB: 05/22/1986  Age: 39 y.o. MRN: [id number]  Chief Complaint  Patient presents with   Hypertension   Mood   Discussed the use of AI scribe software for clinical note transcription with the patient, who gave verbal consent to proceed.  History of Present Illness   Sally Webb is a 39 year old female who presents for a mood follow-up, blood pressure check, and chronic constipation.  She has not started taking amlodipine  for her blood pressure, which she monitors at home. Her blood pressure tends to be higher in the mornings and is affected by certain foods.  She has a history of gastroparesis diagnosed in 2011, experiencing abdominal symptoms and bowel irregularities. Linzess  has been very helpful but occasionally stops working. She takes Linzess  first thing in the morning on an empty stomach, and it typically works about an hour after eating, providing relief. She also uses Miralax at night to maintain bowel regularity. An attempt to stop Linzess  over a weekend resulted in significant discomfort and constipation, making her feel 'like I was going to die.' She is concerned about the limited number of refills available for Linzess .  She follows an IBS-C diet, which has been beneficial in reducing bloating and discomfort. She avoids processed foods and certain fruits like grapes and pineapples, which trigger immediate reactions due to their high sugar content. She is considering a food allergy test due to observed reactions to dairy and her known egg allergy.  Her fluid intake is adequate, consisting of water, Body Armor zero sugar, and tea. She has noticed weight changes associated with her bowel issues, which have been distressing. Her last bowel movement was this morning. She experiences gas pain when using Docusate, which she took after finishing a bottle of Linzess , leading to discomfort and bloating.           12/11/2024   11:09 AM 02/10/2024    9:41 AM  GAD 7 : Generalized Anxiety Score  Nervous, Anxious, on Edge 1 1   Control/stop worrying 1 1   Worry too much - different things 2 1   Trouble relaxing 1 2   Restless 0 1   Easily annoyed or irritable 1 2   Afraid - awful might happen 1 0   Total GAD 7 Score 7 8  Anxiety Difficulty  Somewhat difficult     Data saved with a previous flowsheet row definition      12/11/2024   11:09 AM 02/10/2024    9:41 AM 07/30/2022   11:01 AM  PHQ9 SCORE ONLY  PHQ-9 Total Score 7 15  2       Data saved with a previous flowsheet row definition   ROS: see HPI     Objective:    BP 130/84   Pulse 60   Resp 16   Ht 5' 4 (1.626 m)   Wt 176 lb (79.8 kg)   LMP 11/23/2024   SpO2 99%   BMI 30.21 kg/m  BP Readings from Last 3 Encounters:  12/11/24 130/84  10/22/24 138/88  02/10/24 131/87    Physical Exam Vitals reviewed.  Constitutional:      Appearance: Normal appearance.  Cardiovascular:     Rate and Rhythm: Normal rate and regular rhythm.     Pulses: Normal pulses.     Heart sounds: Normal heart sounds.  Pulmonary:     Effort: Pulmonary effort is normal.  Breath sounds: Normal breath sounds.  Abdominal:     General: Bowel sounds are normal. There is no distension.     Palpations: Abdomen is soft. There is no mass.     Tenderness: There is no abdominal tenderness.  Neurological:     Mental Status: She is alert.  Psychiatric:        Mood and Affect: Mood normal.        Behavior: Behavior normal.       Assessment & Plan:   1. Irritable bowel syndrome with constipation (Primary) Chronic IBS-C managed with Linzess . Improved symptoms with daily use. KUB showed stool retention in ascending colon. Concerns about fiber intake due to bloating symptoms. Increased Linzess  to 290 mcg. Continue Linzess  on empty stomach before meals. Increase fluid intake, and consider increasing fiber intake if tolerated. She has an upcoming new  patient appointment with GI on 2/17.    - linaclotide  (LINZESS ) 290 MCG CAPS capsule; Take 1 capsule (290 mcg total) by mouth daily before breakfast.  Dispense: 30 capsule; Refill: 5  2. GAD (generalized anxiety disorder) Reports her mood is well controlled, associating with her previous elevated scores with frustration of constipation issues.  3. Blood pressure elevated without history of HTN Patient presents today with well-controlled blood pressure. Patient in no acute distress and is well-appearing. Denies chest pain, shortness of breath, lower extremity edema, vision changes, headaches. Cardiovascular exam with heart regular rate and rhythm. Normal heart sounds, no murmurs present. No lower extremity edema present. Lungs clear to auscultation bilaterally. She is not on pharmacotherapy. Managed with lifestyle modifications. Advised patient to continue checking blood pressure occasionally and return to office sooner than scheduled follow-up if readings increase above 130/80.    Return in about 6 months (around 06/10/2025) for Physical with fasting labs.    Evalene Arts, FNP "

## 2024-12-11 NOTE — Patient Instructions (Signed)

## 2024-12-27 ENCOUNTER — Ambulatory Visit
Admission: RE | Admit: 2024-12-27 | Discharge: 2024-12-27 | Disposition: A | Discharge status: Home / Self Care | Source: Home / Self Care

## 2024-12-27 VITALS — BP 133/85 | HR 57 | Temp 97.9°F | Resp 16 | Wt 178.0 lb

## 2024-12-27 DIAGNOSIS — M7912 Myalgia of auxiliary muscles, head and neck: Secondary | ICD-10-CM

## 2024-12-27 DIAGNOSIS — M542 Cervicalgia: Secondary | ICD-10-CM

## 2024-12-27 MED ORDER — PREDNISONE 10 MG (21) PO TBPK: ORAL_TABLET | Freq: Every day | ORAL | 0 refills | Status: AC

## 2024-12-27 NOTE — ED Provider Notes (Signed)
 " [facility]    CSN: [id number] Arrival date & time: 12/27/24  1246      History   Chief Complaint Chief Complaint  Patient presents with   Neck Pain    HPI Sally Webb is a 39 y.o. female.   Patient presents today with right sided neck pain with muscle tenderness that radiates down to her right shoulder.  This discomfort has been going on for approximately 3 weeks.  Patient was doing at home exercises with minimal relief.  Denies any injury.  No shortness of breath no chest pain no known illness.    Past Medical History:  Diagnosis Date   Anxiety    Depression    post pardum   Gastroparesis    GERD (gastroesophageal reflux disease)     Patient Active Problem List   Diagnosis Date Noted   Irritable bowel syndrome with constipation 12/11/2024   Constipation 10/26/2024   Cervical high risk human papillomavirus (HPV) DNA test positive 10/11/2022   Menometrorrhagia 10/11/2022   Need for hepatitis C screening test 07/30/2022   Generalized abdominal pain 07/30/2022   Dysmenorrhea 07/30/2022   Frequent headaches 07/30/2022   Encounter to establish care 07/30/2022   Nausea and vomiting 06/06/2017   Night sweats 02/04/2014   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 Authorizing Provider  linaclotide  (LINZESS ) 290 MCG CAPS capsule Take 1 capsule (290 mcg total) by mouth daily before breakfast. 12/11/24  Yes Towana Small, FNP  predniSONE  (STERAPRED UNI-PAK 21 TAB) 10 MG (21) TBPK tablet Take  by mouth daily. Take 6 tabs by mouth daily  for 2 days, then 5 tabs for 2 days, then 4 tabs for 2 days, then 3 tabs for 2 days, 2 tabs for 2 days, then 1 tab by mouth daily for 2 days 12/27/24  Yes Merilee Hollering L, NP  docusate sodium (STOOL SOFTENER) 250 MG capsule Take 250 mg by mouth daily.    [provider]  polyethylene glycol (MIRALAX / GLYCOLAX) 17 g packet Take 17 g by mouth daily.    [provider]    Family History Family History  Problem Relation Age of Onset   Diabetes Brother    Hypertension Brother    Heart failure Maternal Grandmother    Heart failure Maternal Grandfather    Cancer - Prostate Maternal Grandfather    Heart attack Paternal Grandfather    Hypertension Sister    Diabetes Paternal Grandfather    Hypertension Paternal Grandfather     Social History Social History[1]   Allergies   Egg protein-containing drug products and Egg solids, whole   Review of Systems Review of Systems  Constitutional: Negative.  Respiratory: Negative.    Cardiovascular: Negative.   Gastrointestinal: Negative.   Musculoskeletal:  Positive for neck pain and neck stiffness. Negative for back pain and joint swelling.  Skin: Negative.   Neurological: Negative.      Physical Exam Triage Vital Signs ED Triage Vitals  Encounter Vitals Group     BP 12/27/24 1318 133/85     Girls Systolic BP Percentile --      Girls Diastolic BP Percentile --      Boys Systolic BP Percentile --      Boys Diastolic BP Percentile --      Pulse Rate 12/27/24 1318 (!) 57     Resp 12/27/24 1318 16     Temp 12/27/24 1318 97.9 F (36.6 C)     Temp Source 12/27/24 1318 Oral     SpO2 12/27/24 1318 97 %     Weight 12/27/24 1315 178 lb (80.7 kg)     Height --      Head Circumference --      Peak Flow --      Pain Score 12/27/24 1315 10     Pain Loc --      Pain Education --      Exclude from Growth Chart --    No data found.  Updated Vital Signs BP 133/85 (BP  Location: Left Arm)   Pulse (!) 57   Temp 97.9 F (36.6 C) (Oral)   Resp 16   Wt 178 lb (80.7 kg)   LMP 12/20/2024 (Approximate)   SpO2 97%   BMI 30.55 kg/m   Visual Acuity Right Eye Distance:   Left Eye Distance:   Bilateral Distance:    Right Eye Near:   Left Eye Near:    Bilateral Near:     Physical Exam Constitutional:      Appearance: Normal appearance.  Cardiovascular:     Rate and Rhythm: Normal rate.  Pulmonary:     Effort: Pulmonary effort is normal.  Abdominal:     General: Abdomen is flat.  Musculoskeletal:        General: Tenderness present. No deformity or signs of injury.     Comments: Decreased range of motion when turning to the right side of rotating neck.  Discomfort with chin to chest.  Tenderness to palpation with rotating and head.  Skin:    General: Skin is warm and dry.     Capillary Refill: Capillary refill takes less than 2 seconds.  Neurological:     General: No focal deficit present.     Mental Status: She is alert.      UC Treatments / Results  Labs (all labs ordered are listed, but only abnormal results are displayed) Labs Reviewed - No data to display  EKG   Radiology No results found.  Procedures Procedures (including critical care time)  Medications Ordered in UC Medications - No data to display  Initial Impression / Assessment and Plan / UC Course  I have reviewed the triage vital signs and the nursing notes.  Pertinent labs & imaging results that were available during my care of the patient were reviewed by me and considered in my medical decision making (see chart for details).     Take Tylenol or Motrin as needed for pain Use heat such as a heating pad or warm showers to sooth If symptoms are not better in 72 hours you will need to follow-up with [facility] for further testing and possible CT scan Slowly start to  perform neck range of motion Could also use Bengay cream or a lidocaine patch to help  sooth Discussed stretching exercises to begin Final Clinical Impressions(s) / UC Diagnoses   Final diagnoses:  Neck pain on right side  Sternocleidomastoid muscle tenderness     Discharge Instructions      Take Tylenol or Motrin as needed for pain Use heat such as a heating pad or warm showers to sooth If symptoms are not better in 72 hours you will need to follow-up with [facility] for further testing and possible CT scan Slowly start to perform neck range of motion Could also use Bengay cream or a lidocaine patch to help sooth      ED Prescriptions     Medication Sig Dispense Auth. Provider   predniSONE  (STERAPRED UNI-PAK 21 TAB) 10 MG (21) TBPK tablet Take by mouth daily. Take 6 tabs by mouth daily  for 2 days, then 5 tabs for 2 days, then 4 tabs for 2 days, then 3 tabs for 2 days, 2 tabs for 2 days, then 1 tab by mouth daily for 2 days 42 tablet Merilee Andrea CROME, NP      PDMP not reviewed this encounter.     [1]  Social History Tobacco Use   Smoking status: Never    Passive exposure: Never   Smokeless tobacco: Never  Vaping Use   Vaping status: Never Used  Substance Use Topics   Alcohol use: Yes    Alcohol/week: 2.0 standard drinks of alcohol    Types: 2 Glasses of wine per week    Comment: on weekend   Drug use: Never     Merilee Andrea CROME, NP 12/27/24 1506  "

## 2024-12-27 NOTE — Discharge Instructions (Addendum)
 Take Tylenol or Motrin as needed for pain Use heat such as a heating pad or warm showers to sooth If symptoms are not better in 72 hours you will need to follow-up with [facility] for further testing and possible CT scan Slowly start to perform neck range of motion Could also use Bengay cream or a lidocaine patch to help sooth

## 2024-12-27 NOTE — ED Triage Notes (Signed)
 Pt presents with right side neck pain that radiates into her shoulders x 3 weeks. Pt denies any known injury and has not taken anything for the pain.

## 2025-01-08 ENCOUNTER — Ambulatory Visit: Admitting: Gastroenterology

## 2025-06-04 ENCOUNTER — Ambulatory Visit

## 2025-06-11 ENCOUNTER — Encounter: Admitting: Family Medicine
